# Patient Record
Sex: Male | Born: 1980 | Race: Asian | Hispanic: Yes | Marital: Married | Smoking: Current some day smoker
Health system: Southern US, Community
[De-identification: ages and names within clinical notes are randomized; demographics above are authoritative.]

## PROBLEM LIST (undated history)

## (undated) DIAGNOSIS — E119 Type 2 diabetes mellitus without complications: Secondary | ICD-10-CM

---

## 2008-09-11 ENCOUNTER — Emergency Department (HOSPITAL_COMMUNITY): Admission: EM | Admit: 2008-09-11 | Discharge: 2008-09-11 | Payer: Self-pay | Admitting: Emergency Medicine

## 2010-05-15 LAB — LIPASE, BLOOD: Lipase: 27 U/L (ref 11–59)

## 2010-05-15 LAB — URINALYSIS, ROUTINE W REFLEX MICROSCOPIC
Bilirubin Urine: NEGATIVE
Hgb urine dipstick: NEGATIVE
Ketones, ur: NEGATIVE mg/dL
Nitrite: NEGATIVE
Specific Gravity, Urine: 1.023 (ref 1.005–1.030)
pH: 6 (ref 5.0–8.0)

## 2010-05-15 LAB — CBC
Hemoglobin: 15.7 g/dL (ref 13.0–17.0)
MCHC: 34.1 g/dL (ref 30.0–36.0)
MCV: 90.8 fL (ref 78.0–100.0)
RBC: 5.06 MIL/uL (ref 4.22–5.81)
RDW: 13.2 % (ref 11.5–15.5)

## 2010-05-15 LAB — COMPREHENSIVE METABOLIC PANEL
BUN: 10 mg/dL (ref 6–23)
CO2: 22 mEq/L (ref 19–32)
Calcium: 9.4 mg/dL (ref 8.4–10.5)
Creatinine, Ser: 0.72 mg/dL (ref 0.4–1.5)
GFR calc non Af Amer: >60 mL/min (ref >60)
Glucose, Bld: 112 mg/dL — ABNORMAL HIGH (ref 70–99)
Total Protein: 7.9 g/dL (ref 6.0–8.3)

## 2010-05-15 LAB — DIFFERENTIAL
Eosinophils Absolute: 0.1 10*3/uL (ref 0.0–0.7)
Lymphocytes Relative: 8 % — ABNORMAL LOW (ref 12–46)
Lymphs Abs: 1.1 10*3/uL (ref 0.7–4.0)
Monocytes Relative: 3 % (ref 3–12)
Neutro Abs: 13.2 10*3/uL — ABNORMAL HIGH (ref 1.7–7.7)
Neutrophils Relative %: 89 % — ABNORMAL HIGH (ref 43–77)

## 2014-03-15 ENCOUNTER — Encounter (HOSPITAL_COMMUNITY): Payer: Self-pay | Admitting: *Deleted

## 2014-03-15 ENCOUNTER — Emergency Department (HOSPITAL_COMMUNITY)
Admission: EM | Admit: 2014-03-15 | Discharge: 2014-03-15 | Disposition: A | Discharge status: Home / Self Care | Payer: Self-pay | Attending: Emergency Medicine | Admitting: Emergency Medicine

## 2014-03-15 ENCOUNTER — Emergency Department (HOSPITAL_COMMUNITY): Payer: Self-pay

## 2014-03-15 DIAGNOSIS — Y990 Civilian activity done for income or pay: Secondary | ICD-10-CM | POA: Insufficient documentation

## 2014-03-15 DIAGNOSIS — Y9389 Activity, other specified: Secondary | ICD-10-CM | POA: Insufficient documentation

## 2014-03-15 DIAGNOSIS — S8992XA Unspecified injury of left lower leg, initial encounter: Secondary | ICD-10-CM | POA: Insufficient documentation

## 2014-03-15 DIAGNOSIS — W228XXA Striking against or struck by other objects, initial encounter: Secondary | ICD-10-CM | POA: Insufficient documentation

## 2014-03-15 DIAGNOSIS — M25562 Pain in left knee: Secondary | ICD-10-CM

## 2014-03-15 DIAGNOSIS — Y9289 Other specified places as the place of occurrence of the external cause: Secondary | ICD-10-CM | POA: Insufficient documentation

## 2014-03-15 MED ORDER — NAPROXEN 500 MG PO TABS
500.0000 mg | ORAL_TABLET | Freq: Once | ORAL | Status: AC
Start: 2014-03-15 — End: 2014-03-15
  Administered 2014-03-15: 500 mg via ORAL
  Filled 2014-03-15: qty 1

## 2014-03-15 MED ORDER — OXYCODONE-ACETAMINOPHEN 5-325 MG PO TABS
1.0000 | ORAL_TABLET | Freq: Once | ORAL | Status: AC
  Administered 2014-03-15: 1 via ORAL
  Filled 2014-03-15: qty 1

## 2014-03-15 MED ORDER — NAPROXEN 500 MG PO TABS: 500.0000 mg | ORAL_TABLET | Freq: Two times a day (BID) | ORAL | Status: AC

## 2014-03-15 MED ORDER — HYDROCODONE-ACETAMINOPHEN 5-325 MG PO TABS: 1.0000 | ORAL_TABLET | ORAL | Status: AC | PRN

## 2014-03-15 NOTE — ED Notes (Signed)
Pt speaks limited [place], speaks [place]. Between patient and daughter pt reports he hit his left knee on some metal today at work. Pain 5/10. Pt able to ambulate slowly.

## 2014-03-15 NOTE — Discharge Instructions (Signed)
Please follow the directions provided. Be sure to follow up with the orthopedic doctor for further management of your knee pain. Please wear your knee brace for comfort. Please use your crutches for comfort. Please take the naproxen twice a day to help with pain. Please take the Vicodin for pain not relieved by the naproxen. Don't hesitate to return for any new, worsening, or concerning symptoms.   SEEK IMMEDIATE MEDICAL CARE IF:  Your fingers or toes are numb or blue.  The pain is not responding to medications and continues to stay the same or get worse.  The pain in your joint becomes severe.  You develop a fever over 102 F (38.9 C).  It becomes impossible to move or use the joint.

## 2014-03-15 NOTE — ED Notes (Signed)
Pt to xray

## 2014-03-15 NOTE — ED Provider Notes (Signed)
CSN: [id number]     Arrival date & time 03/15/14  1306 History  This chart was scribed for a non-physician practitioner, Clerance Lav, NP working with No att. providers found by [place] Peace, ED Scribe. The patient was seen in [facility]. The patient's care was started at 2:45 PM.    Chief Complaint  Patient presents with  . Knee Pain    Patient is a 34 y.o. male presenting with knee pain. The history is provided by the patient. No language interpreter was used.  Knee Pain Location:  Knee Knee location:  L knee Pain details:    Severity:  Moderate (5/10)   Duration:  3 hours Chronicity:  New Dislocation: no   Exacerbated by: ambulation. Associated symptoms: no fever    HPI Comments: Jermaine Bates is a 34 y.o. male who presents to the Emergency Department complaining of left knee injury onset 11:00 AM while pt was at work. Pt hit his knee on a piece of metal. Pain exacerbated with ambulation. He hit his knee specifically on piece of metal attached to the machine that he was getting inside of. Pt rates pain as 5 or 6 when walking but otherwise does not hurt. He denies history of knee pain in the past.    History reviewed. No pertinent past medical history. No past surgical history on file. History reviewed. No pertinent family history. History  Substance Use Topics  . Smoking status: Not on file  . Smokeless tobacco: Not on file  . Alcohol Use: Not on file    Review of Systems  Constitutional: Negative for fever.  Gastrointestinal: Negative for nausea, vomiting and diarrhea.  Musculoskeletal: Positive for arthralgias.       Right knee pain.   Skin: Negative for wound.      Allergies  Review of patient's allergies indicates no known allergies.  Home Medications   Prior to Admission medications   Not on File   BP 131/80 mmHg  Pulse 90  Temp(Src) 97.6 F (36.4 C) (Oral)  Resp 16  SpO2 96% Physical Exam  Constitutional: He is oriented to person, place, and  time. He appears well-developed and well-nourished. No distress.  HENT:  Head: Normocephalic and atraumatic.  Eyes: Conjunctivae and EOM are normal.  Neck: Neck supple. No tracheal deviation present.  Cardiovascular: Normal rate.   Pulmonary/Chest: Effort normal. No respiratory distress.  Musculoskeletal: Normal range of motion. He exhibits tenderness.  Left Knee: TTP across entire patella. 5/5 strength with plantar and dorsal flexion. No obvious deformity or swelling. Small 1cm area of redness in center of patella, skin is intact.   Neurological: He is alert and oriented to person, place, and time.  Skin: Skin is warm and dry.  Psychiatric: He has a normal mood and affect. His behavior is normal.  Nursing note and vitals reviewed.   ED Course  Procedures (including critical care time) Labs Review Labs Reviewed - No data to display  Imaging Review Dg Knee Complete 4 Views Left  03/15/2014   CLINICAL DATA:  Hit left knee on metal today at work. Pain primarily involving the anterior aspect of the patella. No previous injury. Initial encounter.  EXAM: LEFT KNEE - COMPLETE 4+ VIEW  COMPARISON:  None.  FINDINGS: No fracture or dislocation. Joint spaces are preserved. No joint effusion. Regional soft tissues appear normal. No radiopaque foreign body.  IMPRESSION: No acute findings with special attention paid to the patella.   Electronically Signed   By: Holland Commons.D.  On: 03/15/2014 14:14     EKG Interpretation None     Medications - No data to display  2:52 PM- Treatment plan was discussed with patient who verbalizes understanding and agrees.   MDM   Final diagnoses:  Left knee pain   34 yo male with knee pain. No laceration noted or obvious injury noted. His X-Ray negative for obvious fracture or dislocation. Pain managed in ED. He was advised to follow up with orthopedics if symptoms persist for possibility of missed fracture diagnosis. Knee sleeve given in the ED, and used the  translator to discuss conservative therapy recommended and discussed. Patient will be dc home & is agreeable with above plan.    I personally performed the services described in this documentation, which was scribed in my presence. The recorded information has been reviewed and is accurate.  Filed Vitals:   03/15/14 1319 03/15/14 1503  BP: 131/80 129/75  Pulse: 90 91  Temp: 97.6 F (36.4 C)   TempSrc: Oral   Resp: 16 18  SpO2: 96% 98%   Meds given in ED:  Medications  oxyCODONE-acetaminophen (PERCOCET/ROXICET) 5-325 MG per tablet 1 tablet (1 tablet Oral Given 03/15/14 1502)  naproxen (NAPROSYN) tablet 500 mg (500 mg Oral Given 03/15/14 1502)    Discharge Medication List as of 03/15/2014  3:10 PM    START taking these medications   Details  HYDROcodone-acetaminophen (NORCO/VICODIN) 5-325 MG per tablet Take 1 tablet by mouth every 4 (four) hours as needed for moderate pain or severe pain., Starting 03/15/2014, Until Discontinued, Print    naproxen (NAPROSYN) 500 MG tablet Take 1 tablet (500 mg total) by mouth 2 (two) times daily., Starting 03/15/2014, Until Discontinued, Print           Harle BattiestElizabeth Jeffery Bachmeier, NP 03/28/14 1031  Toy CookeyMegan Docherty, MD 03/29/14 [phone]

## 2021-06-16 ENCOUNTER — Emergency Department (HOSPITAL_COMMUNITY): Payer: Self-pay

## 2021-06-16 ENCOUNTER — Other Ambulatory Visit: Payer: Self-pay

## 2021-06-16 ENCOUNTER — Emergency Department (HOSPITAL_COMMUNITY)
Admission: EM | Admit: 2021-06-16 | Discharge: 2021-06-17 | Disposition: A | Discharge status: Home / Self Care | Payer: Self-pay | Attending: Emergency Medicine | Admitting: Emergency Medicine

## 2021-06-16 DIAGNOSIS — R739 Hyperglycemia, unspecified: Secondary | ICD-10-CM | POA: Insufficient documentation

## 2021-06-16 DIAGNOSIS — K529 Noninfective gastroenteritis and colitis, unspecified: Secondary | ICD-10-CM | POA: Insufficient documentation

## 2021-06-16 DIAGNOSIS — R824 Acetonuria: Secondary | ICD-10-CM | POA: Insufficient documentation

## 2021-06-16 DIAGNOSIS — E871 Hypo-osmolality and hyponatremia: Secondary | ICD-10-CM | POA: Insufficient documentation

## 2021-06-16 DIAGNOSIS — R944 Abnormal results of kidney function studies: Secondary | ICD-10-CM | POA: Insufficient documentation

## 2021-06-16 DIAGNOSIS — Z7984 Long term (current) use of oral hypoglycemic drugs: Secondary | ICD-10-CM | POA: Insufficient documentation

## 2021-06-16 LAB — CBC WITH DIFFERENTIAL/PLATELET
Abs Immature Granulocytes: 0.03 10*3/uL (ref 0.00–0.07)
Basophils Absolute: 0 10*3/uL (ref 0.0–0.1)
Basophils Relative: 0 %
Eosinophils Absolute: 0.3 10*3/uL (ref 0.0–0.5)
Eosinophils Relative: 4 %
HCT: 47.8 % (ref 39.0–52.0)
Hemoglobin: 16.8 g/dL (ref 13.0–17.0)
Immature Granulocytes: 0 %
Lymphocytes Relative: 18 %
Lymphs Abs: 1.5 10*3/uL (ref 0.7–4.0)
MCH: 31.2 pg (ref 26.0–34.0)
MCHC: 35.1 g/dL (ref 30.0–36.0)
MCV: 88.8 fL (ref 80.0–100.0)
Monocytes Absolute: 0.5 10*3/uL (ref 0.1–1.0)
Monocytes Relative: 7 %
Neutro Abs: 5.7 10*3/uL (ref 1.7–7.7)
Neutrophils Relative %: 71 %
Platelets: 232 10*3/uL (ref 150–400)
RBC: 5.38 MIL/uL (ref 4.22–5.81)
RDW: 12 % (ref 11.5–15.5)
WBC: 8.1 10*3/uL (ref 4.0–10.5)
nRBC: 0 % (ref 0.0–0.2)

## 2021-06-16 LAB — URINALYSIS, ROUTINE W REFLEX MICROSCOPIC
Bacteria, UA: NONE SEEN
Bilirubin Urine: NEGATIVE
Glucose, UA: >=500 mg/dL — AB
Hgb urine dipstick: NEGATIVE
Ketones, ur: 5 mg/dL — AB
Leukocytes,Ua: NEGATIVE
Nitrite: NEGATIVE
Protein, ur: NEGATIVE mg/dL
Specific Gravity, Urine: >1.046 — ABNORMAL HIGH (ref 1.005–1.030)
pH: 8 (ref 5.0–8.0)

## 2021-06-16 LAB — COMPREHENSIVE METABOLIC PANEL
ALT: 35 U/L (ref 0–44)
AST: 28 U/L (ref 15–41)
Albumin: 4.3 g/dL (ref 3.5–5.0)
Alkaline Phosphatase: 79 U/L (ref 38–126)
Anion gap: 9 (ref 5–15)
BUN: 15 mg/dL (ref 6–20)
CO2: 25 mmol/L (ref 22–32)
Calcium: 8.5 mg/dL — ABNORMAL LOW (ref 8.9–10.3)
Chloride: 99 mmol/L (ref 98–111)
Creatinine, Ser: 0.52 mg/dL — ABNORMAL LOW (ref 0.61–1.24)
GFR, Estimated: >60 mL/min (ref >60)
Glucose, Bld: 363 mg/dL — ABNORMAL HIGH (ref 70–99)
Potassium: 3.8 mmol/L (ref 3.5–5.1)
Sodium: 133 mmol/L — ABNORMAL LOW (ref 135–145)
Total Bilirubin: 1 mg/dL (ref 0.3–1.2)
Total Protein: 7.8 g/dL (ref 6.5–8.1)

## 2021-06-16 LAB — BLOOD GAS, VENOUS
Acid-Base Excess: 3.7 mmol/L — ABNORMAL HIGH (ref 0.0–2.0)
Bicarbonate: 28.5 mmol/L — ABNORMAL HIGH (ref 20.0–28.0)
Drawn by: 54030
O2 Saturation: 78.6 %
Patient temperature: 37
pCO2, Ven: 43 mmHg — ABNORMAL LOW (ref 44–60)
pH, Ven: 7.43 (ref 7.25–7.43)
pO2, Ven: 42 mmHg (ref 32–45)

## 2021-06-16 LAB — LIPASE, BLOOD: Lipase: 41 U/L (ref 11–51)

## 2021-06-16 MED ORDER — SODIUM CHLORIDE 0.9 % IV BOLUS
1000.0000 mL | Freq: Once | INTRAVENOUS | Status: AC
  Administered 2021-06-16: 1000 mL via INTRAVENOUS

## 2021-06-16 MED ORDER — DICYCLOMINE HCL 10 MG/ML IM SOLN
20.0000 mg | Freq: Once | INTRAMUSCULAR | Status: AC
  Administered 2021-06-16: 20 mg via INTRAMUSCULAR
  Filled 2021-06-16: qty 2

## 2021-06-16 MED ORDER — ONDANSETRON HCL 4 MG/2ML IJ SOLN
4.0000 mg | Freq: Once | INTRAMUSCULAR | Status: AC
  Administered 2021-06-16: 4 mg via INTRAVENOUS
  Filled 2021-06-16: qty 2

## 2021-06-16 MED ORDER — IOHEXOL 300 MG/ML  SOLN
100.0000 mL | Freq: Once | INTRAMUSCULAR | Status: AC | PRN
  Administered 2021-06-16: 100 mL via INTRAVENOUS

## 2021-06-16 NOTE — ED Triage Notes (Signed)
Pt reports with vomiting, fever, abdominal pain, and diarrhea x 2 days.  ?

## 2021-06-16 NOTE — Discharge Instructions (Addendum)
Like we discussed, your symptoms appear consistent with gastroenteritis.  Your CT scan showed both enteritis as well as colitis. ? ?I am going to prescribe you 3 medications for your symptoms. ? ?The first medication is called Zofran.  This is a medication you can take up to 3 times per day for breakthrough nausea and vomiting.  Please only take it as prescribed. ? ?The second medication is called Bentyl.  This medication can help with diarrhea.  You can take it up to 2 times per day as needed for severe diarrhea. ? ?The third medication is called metformin.  This is for your high blood sugar.  Please take 500 mg twice per day.  This medication can cause stomach irritation as well as diarrhea.  Please follow-up with your regular doctor to have your blood sugar rechecked. ? ?If you develop any new or worsening symptoms please come back to the emergency department. ?

## 2021-06-16 NOTE — ED Provider Notes (Signed)
?[facility]-EMERGENCY DEPT ?Provider Note ? ? ?CSN: [id number] ?Arrival date & time: 06/16/21  2152 ? ?  ? ?History ? ?Chief Complaint  ?Patient presents with  ? Emesis  ? Diarrhea  ? Abdominal Pain  ? Fever  ? ? ?Jermaine Bates is a 41 y.o. male. ? ?HPI ?Patient is a 41 year old male who presents to the emergency department due to nausea, vomiting, diarrhea, abdominal pain, as well as subjective fevers.  States his symptoms started about 2 days ago and have been persistent.  Denies any current nausea.  States that he vomited 2 times.  Denies any hematemesis or hematochezia.  Denies chest pain, shortness of breath, cough, urinary complaints. ?  ? ?Home Medications ?Prior to Admission medications   ?Medication Sig Start Date End Date Taking? Authorizing Provider  ?dicyclomine (BENTYL) 20 MG tablet Take 1 tablet (20 mg total) by mouth 2 (two) times daily as needed for spasms. 06/17/21  Yes Placido SouJoldersma, Verble Styron, PA-C  ?metFORMIN (GLUCOPHAGE) 500 MG tablet Take 1 tablet (500 mg total) by mouth 2 (two) times daily with a meal. 06/17/21 08/16/21 Yes Placido SouJoldersma, Shamra Bradeen, PA-C  ?ondansetron (ZOFRAN-ODT) 4 MG disintegrating tablet Take 1 tablet (4 mg total) by mouth every 8 (eight) hours as needed for nausea or vomiting. 06/17/21  Yes Placido SouJoldersma, Kamori Barbier, PA-C  ?HYDROcodone-acetaminophen (NORCO/VICODIN) 5-325 MG per tablet Take 1 tablet by mouth every 4 (four) hours as needed for moderate pain or severe pain. 03/15/14   Harle Battiestysinger, Elizabeth, NP  ?naproxen (NAPROSYN) 500 MG tablet Take 1 tablet (500 mg total) by mouth 2 (two) times daily. 03/15/14   Harle Battiestysinger, Elizabeth, NP  ?   ? ?Allergies    ?Patient has no known allergies.   ? ?Review of Systems   ?Review of Systems  ?All other systems reviewed and are negative. ?Ten systems reviewed and are negative for acute change, except as noted in the HPI.   ?Physical Exam ?Updated Vital Signs ?BP 115/79 (BP Location: Right Arm)   Pulse 71   Temp 98.3 ?F (36.8 ?C) (Oral)   Resp 16    Ht 5\' 7"  (1.702 m)   Wt 79.4 kg   SpO2 100%   BMI 27.41 kg/m?  ?Physical Exam ?Vitals and nursing note reviewed.  ?Constitutional:   ?   General: He is not in acute distress. ?   Appearance: Normal appearance. He is well-developed. He is not ill-appearing, toxic-appearing or diaphoretic.  ?HENT:  ?   Head: Normocephalic and atraumatic.  ?   Right Ear: External ear normal.  ?   Left Ear: External ear normal.  ?   Nose: Nose normal.  ?   Mouth/Throat:  ?   Mouth: Mucous membranes are moist.  ?   Pharynx: Oropharynx is clear. No oropharyngeal exudate or posterior oropharyngeal erythema.  ?Eyes:  ?   Extraocular Movements: Extraocular movements intact.  ?Cardiovascular:  ?   Rate and Rhythm: Normal rate and regular rhythm.  ?   Pulses: Normal pulses.  ?   Heart sounds: Normal heart sounds. No murmur heard. ?  No friction rub. No gallop.  ?Pulmonary:  ?   Effort: Pulmonary effort is normal. No respiratory distress.  ?   Breath sounds: Normal breath sounds. No stridor. No wheezing, rhonchi or rales.  ?Abdominal:  ?   General: Abdomen is flat.  ?   Palpations: Abdomen is soft.  ?   Tenderness: There is abdominal tenderness in the epigastric area, periumbilical area and left lower quadrant.  ?  Comments: Abdomen is flat and soft.  Mild to moderate tenderness appreciated along the epigastrium, periumbilical region, as well as left lower quadrant.  ?Musculoskeletal:     ?   General: Normal range of motion.  ?   Cervical back: Normal range of motion and neck supple. No tenderness.  ?Skin: ?   General: Skin is warm and dry.  ?Neurological:  ?   General: No focal deficit present.  ?   Mental Status: He is alert and oriented to person, place, and time.  ?Psychiatric:     ?   Mood and Affect: Mood normal.     ?   Behavior: Behavior normal.  ? ?ED Results / Procedures / Treatments   ?Labs ?(all labs ordered are listed, but only abnormal results are displayed) ?Labs Reviewed  ?COMPREHENSIVE METABOLIC PANEL - Abnormal; Notable  for the following components:  ?    Result Value  ? Sodium 133 (*)   ? Glucose, Bld 363 (*)   ? Creatinine, Ser 0.52 (*)   ? Calcium 8.5 (*)   ? All other components within normal limits  ?URINALYSIS, ROUTINE W REFLEX MICROSCOPIC - Abnormal; Notable for the following components:  ? Specific Gravity, Urine >1.046 (*)   ? Glucose, UA >=500 (*)   ? Ketones, ur 5 (*)   ? All other components within normal limits  ?BLOOD GAS, VENOUS - Abnormal; Notable for the following components:  ? pCO2, Ven 43 (*)   ? Bicarbonate 28.5 (*)   ? Acid-Base Excess 3.7 (*)   ? All other components within normal limits  ?LIPASE, BLOOD  ?CBC WITH DIFFERENTIAL/PLATELET  ? ?EKG ?None ? ?Radiology ?CT ABDOMEN PELVIS W CONTRAST ? ?Result Date: 06/16/2021 ?CLINICAL DATA:  Abdominal pain, vomiting, fever, and diarrhea. EXAM: CT ABDOMEN AND PELVIS WITH CONTRAST TECHNIQUE: Multidetector CT imaging of the abdomen and pelvis was performed using the standard protocol following bolus administration of intravenous contrast. RADIATION DOSE REDUCTION: This exam was performed according to the departmental dose-optimization program which includes automated exposure control, adjustment of the mA and/or kV according to patient size and/or use of iterative reconstruction technique. CONTRAST:  OMNIPAQUE IOHEXOL 300 MG/ML  SOLN COMPARISON:  09/11/2008. FINDINGS: Lower chest: No acute abnormality. Hepatobiliary: A subcentimeter hypodensity is present in the posterior right lobe of the liver which is too small to further characterize. Fatty infiltration of the liver is noted. The gallbladder is without stones. No biliary ductal dilatation. Pancreas: Unremarkable. No pancreatic ductal dilatation or surrounding inflammatory changes. Spleen: Normal in size without focal abnormality. Adrenals/Urinary Tract: No adrenal nodule or mass. The kidneys enhance symmetrically. No renal calculus or hydronephrosis. There is a subcentimeter hypodensity in the right kidney  which is too small to further characterize. Stomach/Bowel: Stomach is within normal limits. The appendix is not definitely visualized on exam. No evidence of bowel wall thickening, distention, or inflammatory changes. Scattered air-fluid levels are noted in the small and large bowel. Vascular/Lymphatic: No significant vascular findings are present. No enlarged abdominal or pelvic lymph nodes. Reproductive: Prostate is unremarkable. Other: A fat containing inguinal hernia is noted on the left. No abdominopelvic ascites. Musculoskeletal: No acute or significant osseous findings. IMPRESSION: 1. No bowel obstruction or free air. 2. Scattered air-fluid levels in the small and large bowel, may be associated with enteritis/colitis. 3. Hepatic steatosis. Electronically Signed   By: Thornell Sartorius M.D.   On: 06/16/2021 23:45   ? ?Procedures ?Procedures  ? ?Medications Ordered in ED ?Medications  ?sodium chloride  0.9 % bolus 1,000 mL (1,000 mLs Intravenous New Bag/Given 06/16/21 2341)  ?ondansetron [facility]) injection 4 mg (4 mg Intravenous Given 06/16/21 2342)  ?dicyclomine (BENTYL) injection 20 mg (20 mg Intramuscular Given 06/16/21 2343)  ?iohexol (OMNIPAQUE) 300 MG/ML solution 100 mL (100 mLs Intravenous Contrast Given 06/16/21 2328)  ? ?ED Course/ Medical Decision Making/ A&P ?  ?                        ?Medical Decision Making ?Amount and/or Complexity of Data Reviewed ?Labs: ordered. ?Radiology: ordered. ? ?Risk ?Prescription drug management. ? ?Pt is a 41 y.o. male who presents to the emergency department due to abdominal pain, nausea, vomiting, diarrhea that started about 2 days ago. ? ?Labs: ?CBC without abnormalities. ?CMP with a pseudohyponatremia of 133, glucose of 363, creatinine of 0.52, calcium of 8.5. ?UA with elevated specific gravity, greater than 500 glucose, 5 ketones. ?Lipase of 41. ?VBG with a pH of 7.43. ? ?Imaging: ?CT scan of the abdomen/pelvis with contrast showed no bowel obstruction or free air.   Scattered air-fluid levels in the small and large bowel, may be associated with enteritis/colitis.  Hepatic steatosis. ? ?I, Placido Sou, PA-C, personally reviewed and evaluated these images and lab results as

## 2021-06-16 NOTE — ED Notes (Signed)
Pt ambulatory to CT scan

## 2021-06-17 MED ORDER — METFORMIN HCL 500 MG PO TABS: 500.0000 mg | ORAL_TABLET | Freq: Two times a day (BID) | ORAL | 1 refills | Status: AC

## 2021-06-17 MED ORDER — DICYCLOMINE HCL 20 MG PO TABS: 20.0000 mg | ORAL_TABLET | Freq: Two times a day (BID) | ORAL | 0 refills | Status: AC | PRN

## 2021-06-17 MED ORDER — ONDANSETRON 4 MG PO TBDP: 4.0000 mg | ORAL_TABLET | Freq: Three times a day (TID) | ORAL | 0 refills | Status: AC | PRN

## 2023-01-21 ENCOUNTER — Other Ambulatory Visit: Payer: Self-pay

## 2023-01-21 ENCOUNTER — Ambulatory Visit (HOSPITAL_COMMUNITY): Admission: RE | Admit: 2023-01-21 | Payer: Self-pay | Source: Ambulatory Visit

## 2023-01-21 ENCOUNTER — Encounter (HOSPITAL_COMMUNITY): Payer: Self-pay

## 2023-01-21 ENCOUNTER — Emergency Department (HOSPITAL_COMMUNITY)
Admission: EM | Admit: 2023-01-21 | Discharge: 2023-01-21 | Disposition: A | Discharge status: Home / Self Care | Payer: Self-pay | Attending: Student | Admitting: Student

## 2023-01-21 DIAGNOSIS — F17210 Nicotine dependence, cigarettes, uncomplicated: Secondary | ICD-10-CM | POA: Insufficient documentation

## 2023-01-21 DIAGNOSIS — R739 Hyperglycemia, unspecified: Secondary | ICD-10-CM

## 2023-01-21 DIAGNOSIS — Z7984 Long term (current) use of oral hypoglycemic drugs: Secondary | ICD-10-CM | POA: Insufficient documentation

## 2023-01-21 DIAGNOSIS — R42 Dizziness and giddiness: Secondary | ICD-10-CM | POA: Insufficient documentation

## 2023-01-21 DIAGNOSIS — E1165 Type 2 diabetes mellitus with hyperglycemia: Secondary | ICD-10-CM | POA: Insufficient documentation

## 2023-01-21 HISTORY — DX: Type 2 diabetes mellitus without complications: E11.9

## 2023-01-21 LAB — COMPREHENSIVE METABOLIC PANEL
ALT: 27 U/L (ref 0–44)
AST: 19 U/L (ref 15–41)
Albumin: 4.1 g/dL (ref 3.5–5.0)
Alkaline Phosphatase: 95 U/L (ref 38–126)
Anion gap: 9 (ref 5–15)
BUN: 18 mg/dL (ref 6–20)
CO2: 24 mmol/L (ref 22–32)
Calcium: 9.5 mg/dL (ref 8.9–10.3)
Chloride: 99 mmol/L (ref 98–111)
Creatinine, Ser: 0.47 mg/dL — ABNORMAL LOW (ref 0.61–1.24)
GFR, Estimated: >60 mL/min (ref >60)
Glucose, Bld: 461 mg/dL — ABNORMAL HIGH (ref 70–99)
Potassium: 4.2 mmol/L (ref 3.5–5.1)
Sodium: 132 mmol/L — ABNORMAL LOW (ref 135–145)
Total Bilirubin: 0.7 mg/dL (ref <1.2)
Total Protein: 7.5 g/dL (ref 6.5–8.1)

## 2023-01-21 LAB — I-STAT CHEM 8, ED
BUN: 20 mg/dL (ref 6–20)
Calcium, Ion: 1.2 mmol/L (ref 1.15–1.40)
Chloride: 98 mmol/L (ref 98–111)
Creatinine, Ser: 0.6 mg/dL — ABNORMAL LOW (ref 0.61–1.24)
Glucose, Bld: 451 mg/dL — ABNORMAL HIGH (ref 70–99)
HCT: 45 % (ref 39.0–52.0)
Hemoglobin: 15.3 g/dL (ref 13.0–17.0)
Potassium: 4.5 mmol/L (ref 3.5–5.1)
Sodium: 136 mmol/L (ref 135–145)
TCO2: 28 mmol/L (ref 22–32)

## 2023-01-21 LAB — DIFFERENTIAL
Abs Immature Granulocytes: 0.02 10*3/uL (ref 0.00–0.07)
Basophils Absolute: 0 10*3/uL (ref 0.0–0.1)
Basophils Relative: 0 %
Eosinophils Absolute: 0.1 10*3/uL (ref 0.0–0.5)
Eosinophils Relative: 1 %
Immature Granulocytes: 0 %
Lymphocytes Relative: 25 %
Lymphs Abs: 1.8 10*3/uL (ref 0.7–4.0)
Monocytes Absolute: 0.4 10*3/uL (ref 0.1–1.0)
Monocytes Relative: 5 %
Neutro Abs: 5.1 10*3/uL (ref 1.7–7.7)
Neutrophils Relative %: 69 %

## 2023-01-21 LAB — CBC
HCT: 43.2 % (ref 39.0–52.0)
Hemoglobin: 14.9 g/dL (ref 13.0–17.0)
MCH: 30.2 pg (ref 26.0–34.0)
MCHC: 34.5 g/dL (ref 30.0–36.0)
MCV: 87.4 fL (ref 80.0–100.0)
Platelets: 264 10*3/uL (ref 150–400)
RBC: 4.94 MIL/uL (ref 4.22–5.81)
RDW: 11.9 % (ref 11.5–15.5)
WBC: 7.3 10*3/uL (ref 4.0–10.5)
nRBC: 0 % (ref 0.0–0.2)

## 2023-01-21 LAB — CBG MONITORING, ED: Glucose-Capillary: 452 mg/dL — ABNORMAL HIGH (ref 70–99)

## 2023-01-21 LAB — ETHANOL: Alcohol, Ethyl (B): <10 mg/dL (ref <10)

## 2023-01-21 LAB — PROTIME-INR
INR: 1 (ref 0.8–1.2)
Prothrombin Time: 13.5 s (ref 11.4–15.2)

## 2023-01-21 LAB — APTT: aPTT: 27 s (ref 24–36)

## 2023-01-21 MED ORDER — SODIUM CHLORIDE 0.9% FLUSH: 3.0000 mL | Freq: Once | INTRAVENOUS | Status: DC

## 2023-01-21 MED ORDER — INSULIN ASPART 100 UNIT/ML IJ SOLN
10.0000 [IU] | Freq: Once | INTRAMUSCULAR | Status: AC
  Administered 2023-01-21: 10 [IU] via SUBCUTANEOUS

## 2023-01-21 NOTE — Discharge Instructions (Addendum)
Jermaine Bates was seen in the emergency department for evaluation of dizziness.  His symptoms have resolved but he does have significant hyperglycemia (elevated blood sugar).  This likely occurred because he has not taken the metformin in over a week because he has some side effects that he does not like.  Please follow-up with your primary care physician and ask about possible oral hypoglycemic agents due to metformin intolerance.  If these are not an option he may need to start on insulin.  Please return the emergency department if he has any nausea, vomiting, abdominal pain or any other concerning symptoms

## 2023-01-21 NOTE — ED Provider Notes (Signed)
[place] EMERGENCY DEPARTMENT AT [facility] Provider Note  CSN: [id number] Arrival date & time: 01/21/23 1622  Chief Complaint(s) Dizziness  HPI Jermaine Bates is a 42 y.o. male with PMH T2DM on metformin who presents emergency room for evaluation of dizziness.  He states that when he awoke this morning he got out of bed and felt unsteady on his feet with some dizziness.  The symptoms have since resolved but came to the emergency room for further evaluation.  He states that he has not been taking his metformin because it causes anxiety and thus he has been off of this medication for at least 1 week.  Currently denies chest pain, shortness of breath, abdominal pain, nausea, vomiting, headache, fever, numbness tingling, weakness or other systemic or neurological complaints.   Past Medical History Past Medical History:  Diagnosis Date   Diabetes mellitus without complication (HCC)    There are no active problems to display for this patient.  Home Medication(s) Prior to Admission medications   Medication Sig Start Date End Date Taking? Authorizing Provider  dicyclomine (BENTYL) 20 MG tablet Take 1 tablet (20 mg total) by mouth 2 (two) times daily as needed for spasms. 06/17/21   Placido Sou, PA-C  HYDROcodone-acetaminophen (NORCO/VICODIN) 5-325 MG per tablet Take 1 tablet by mouth every 4 (four) hours as needed for moderate pain or severe pain. 03/15/14   Harle Battiest, NP  metFORMIN (GLUCOPHAGE) 500 MG tablet Take 1 tablet (500 mg total) by mouth 2 (two) times daily with a meal. 06/17/21 08/16/21  Placido Sou, PA-C  naproxen (NAPROSYN) 500 MG tablet Take 1 tablet (500 mg total) by mouth 2 (two) times daily. 03/15/14   Harle Battiest, NP  ondansetron (ZOFRAN-ODT) 4 MG disintegrating tablet Take 1 tablet (4 mg total) by mouth every 8 (eight) hours as needed for nausea or vomiting. 06/17/21   Placido Sou, PA-C                                                                                                                                     Past Surgical History History reviewed. No pertinent surgical history. Family History History reviewed. No pertinent family history.  Social History Social History   Tobacco Use   Smoking status: Some Days    Types: Cigarettes   Smokeless tobacco: Never  Substance Use Topics   Alcohol use: Not Currently   Drug use: Never   Allergies Patient has no known allergies.  Review of Systems Review of Systems  Neurological:  Positive for dizziness.    Physical Exam Vital Signs  I have reviewed the triage vital signs BP 118/81 (BP Location: Right Arm)   Pulse 90   Temp 99 F (37.2 C) (Oral)   Resp 16   Ht 5\' 7"  (1.702 m)   Wt 79.4 kg   SpO2 100%   BMI 27.42 kg/m   Physical Exam Constitutional:  General: He is not in acute distress.    Appearance: Normal appearance.  HENT:     Head: Normocephalic and atraumatic.     Nose: No congestion or rhinorrhea.  Eyes:     General:        Right eye: No discharge.        Left eye: No discharge.     Extraocular Movements: Extraocular movements intact.     Pupils: Pupils are equal, round, and reactive to light.  Cardiovascular:     Rate and Rhythm: Normal rate and regular rhythm.     Heart sounds: No murmur heard. Pulmonary:     Effort: No respiratory distress.     Breath sounds: No wheezing or rales.  Abdominal:     General: There is no distension.     Tenderness: There is no abdominal tenderness.  Musculoskeletal:        General: Normal range of motion.     Cervical back: Normal range of motion.  Skin:    General: Skin is warm and dry.  Neurological:     General: No focal deficit present.     Mental Status: He is alert.     ED Results and Treatments Labs (all labs ordered are listed, but only abnormal results are displayed) Labs Reviewed  I-STAT CHEM 8, ED - Abnormal; Notable for the following components:      Result Value    Creatinine, Ser 0.60 (*)    Glucose, Bld 451 (*)    All other components within normal limits  CBG MONITORING, ED - Abnormal; Notable for the following components:   Glucose-Capillary 452 (*)    All other components within normal limits  PROTIME-INR  APTT  CBC  DIFFERENTIAL  COMPREHENSIVE METABOLIC PANEL  ETHANOL                                                                                                                          Radiology No results found.  Pertinent labs & imaging results that were available during my care of the patient were reviewed by me and considered in my medical decision making (see MDM for details).  Medications Ordered in ED Medications  sodium chloride flush (NS) 0.9 % injection 3 mL (has no administration in time range)  Procedures Procedures  (including critical care time)  Medical Decision Making / ED Course   This patient presents to the ED for concern of dizziness, this involves an extensive number of treatment options, and is a complaint that carries with it a high risk of complications and morbidity.  The differential diagnosis includes orthostatic presyncope, cardiogenic presyncope, electrolyte abnormality, hyperglycemia, vertigo  MDM: Patient seen in the emergency room for evaluation of dizziness.  Physical exam is unremarkable with no focal motor or sensory deficits.  No nystagmus including no nystagmus at rest.  Normal gait.  Laboratory evaluation with a glucose of 452, sodium 132 but is otherwise unremarkable.  With normal neurologic exam and symptoms that have resolved we will defer CT imaging at this time.  We had an extended discussion about the patient's hyperglycemia and currently he has not in DKA as he has a normal bicarb and normal anion gap.  I did encourage him to in the short-term continue to take  his metformin and speak with his outpatient primary care physician about other oral hypoglycemic agents.  10 units of insulin given here in the ED.  At this time he currently does not meet inpatient criteria for admission and will be discharged with outpatient follow-up.  Return precautions given which he voiced understanding.   Additional history obtained: -Additional history obtained from wife -External records from outside source obtained and reviewed including: Chart review including previous notes, labs, imaging, consultation notes   Lab Tests: -I ordered, reviewed, and interpreted labs.   The pertinent results include:   Labs Reviewed  I-STAT CHEM 8, ED - Abnormal; Notable for the following components:      Result Value   Creatinine, Ser 0.60 (*)    Glucose, Bld 451 (*)    All other components within normal limits  CBG MONITORING, ED - Abnormal; Notable for the following components:   Glucose-Capillary 452 (*)    All other components within normal limits  PROTIME-INR  APTT  CBC  DIFFERENTIAL  COMPREHENSIVE METABOLIC PANEL  ETHANOL      Medicines ordered and prescription drug management: Meds ordered this encounter  Medications   sodium chloride flush (NS) 0.9 % injection 3 mL    -I have reviewed the patients home medicines and have made adjustments as needed  Critical interventions none   Cardiac Monitoring: The patient was maintained on a cardiac monitor.  I personally viewed and interpreted the cardiac monitored which showed an underlying rhythm of: NSR  Social Determinants of Health:  Factors impacting patients care include: Spanish-speaking, interpreter used   Reevaluation: After the interventions noted above, I reevaluated the patient and found that they have :improved  Co morbidities that complicate the patient evaluation  Past Medical History:  Diagnosis Date   Diabetes mellitus without complication (HCC)       Dispostion: I considered admission  for this patient, but at this time he does not meet inpatient criteria for admission and will be discharged with outpatient follow-up     Final Clinical Impression(s) / ED Diagnoses Final diagnoses:  None     @PCDICTATION @    Glendora Score, MD 01/21/23 2143

## 2023-01-21 NOTE — ED Notes (Signed)
I  used interpreter to triage pt

## 2023-01-21 NOTE — ED Notes (Signed)
ED Provider at bedside. 

## 2023-01-21 NOTE — ED Triage Notes (Signed)
Pt c/o dizziness started upon waking at 0600 today. Pt denies another sx. Pt's LKW 01-20-23 at 2130

## 2023-02-20 IMAGING — CT CT ABD-PELV W/ CM
2 of 5 series · 16 of 46 positions shown, 18 images · IV contrast (agent unspecified)
Comparison: 09/11/2008.

CLINICAL DATA: Abdominal pain, vomiting, fever, and diarrhea.

EXAM:
CT ABDOMEN AND PELVIS WITH CONTRAST
TECHNIQUE: Multidetector CT imaging of the abdomen and pelvis was performed
using the standard protocol following bolus administration of
intravenous contrast.

[Series 2: axial st · axial · 0.86mm/px · z∈[-516,-76]mm · 13 of 104 slices shown, 15 images]
[im 8/104  soft-tissue]
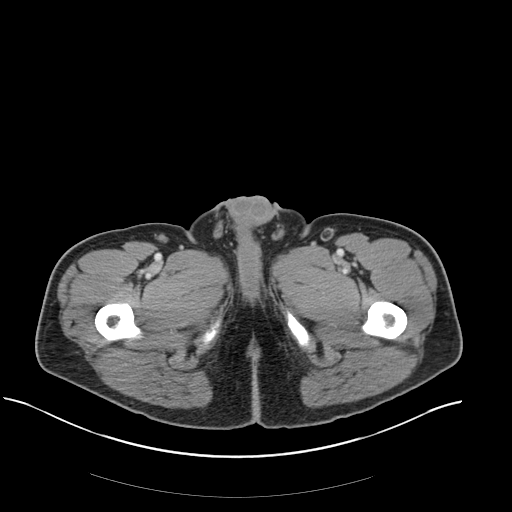
[im 8/104  bone]
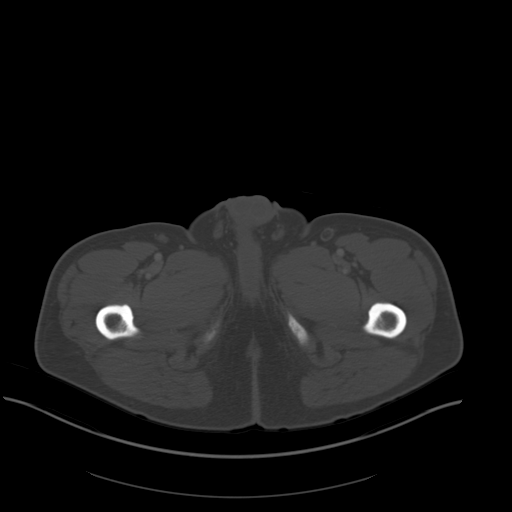
[im 15/104  soft-tissue]
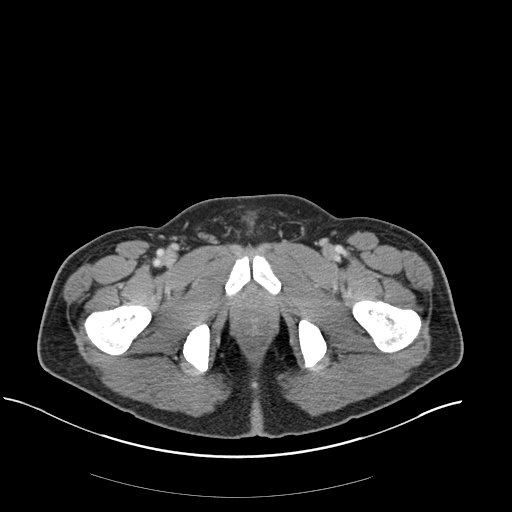
[im 23/104  soft-tissue]
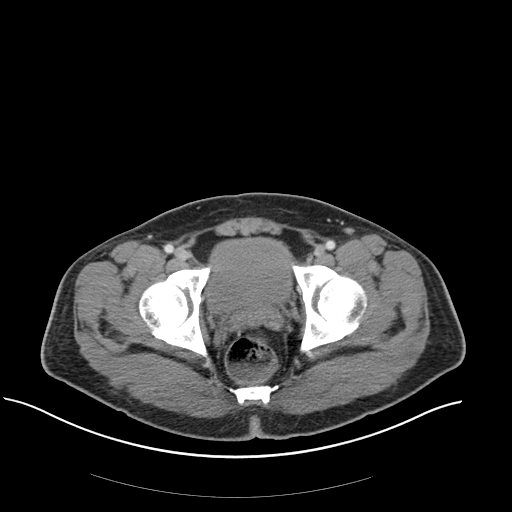
[im 30/104  soft-tissue]
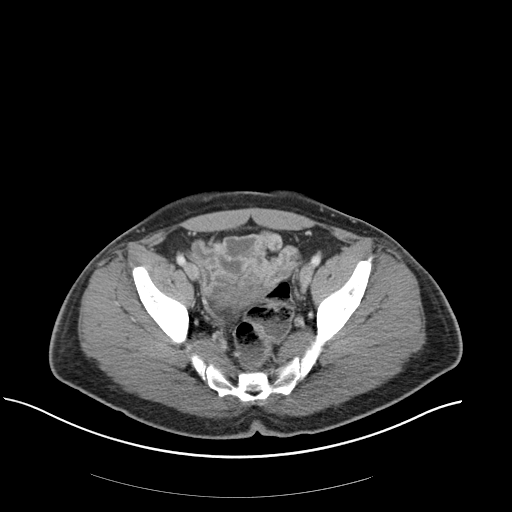
[im 37/104  soft-tissue]
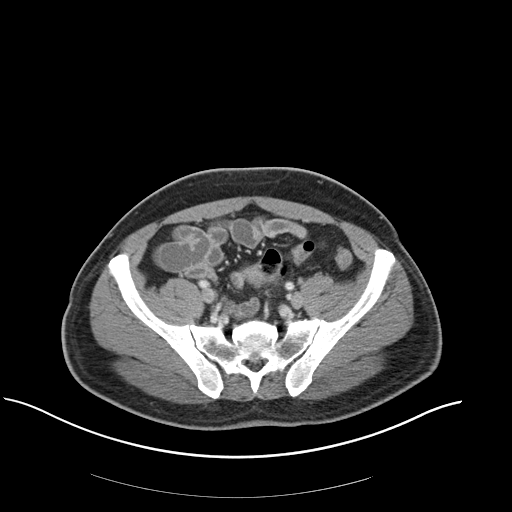
[im 45/104  soft-tissue]
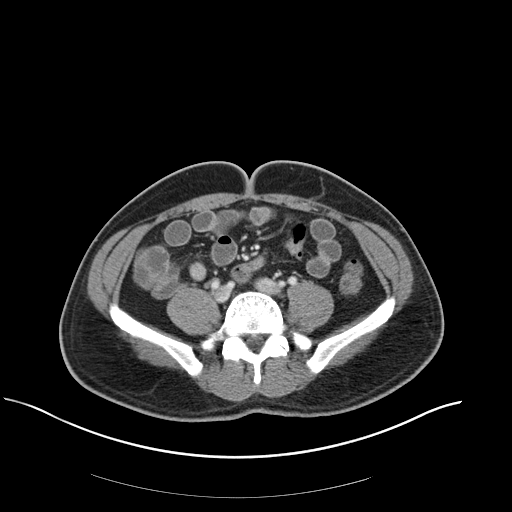
[im 52/104  soft-tissue]
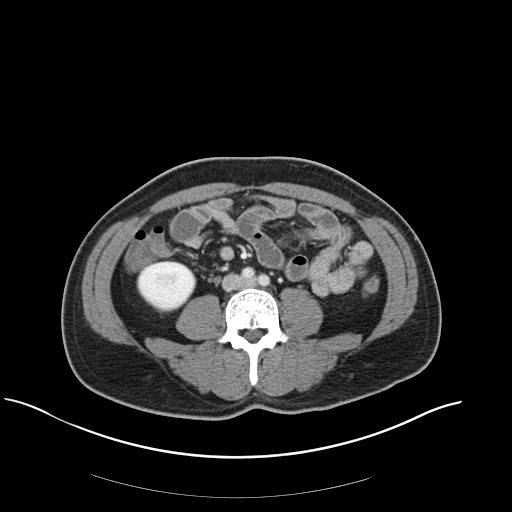
[im 59/104  soft-tissue]
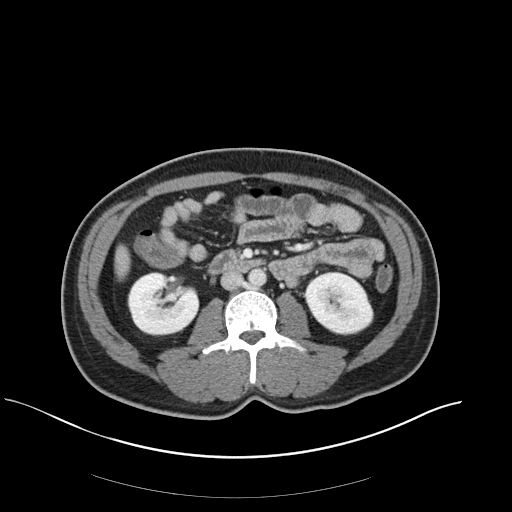
[im 67/104  soft-tissue]
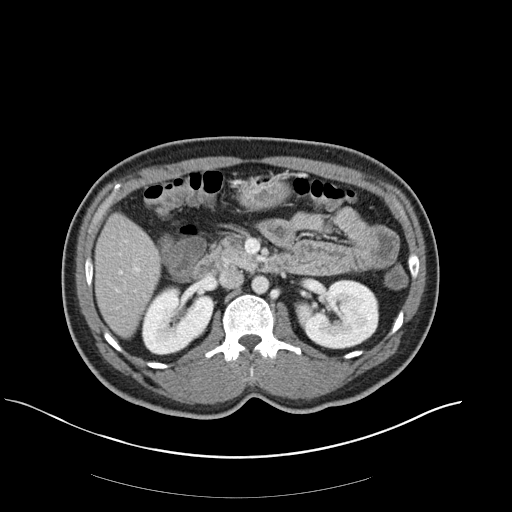
[im 67/104  bone]
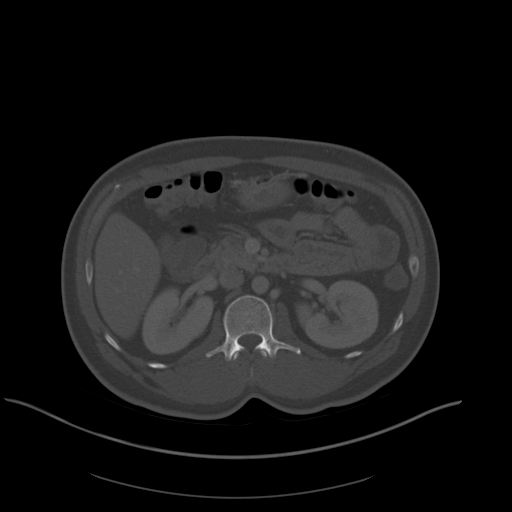
[im 74/104  soft-tissue]
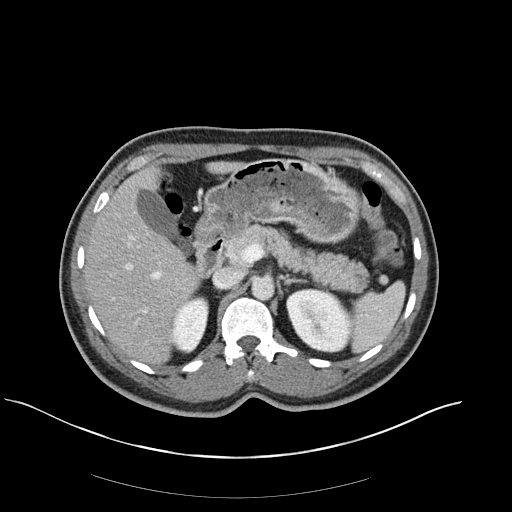
[im 81/104  soft-tissue]
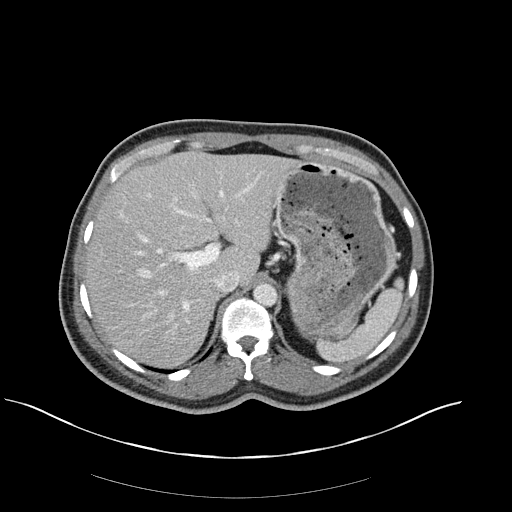
[im 89/104  soft-tissue]
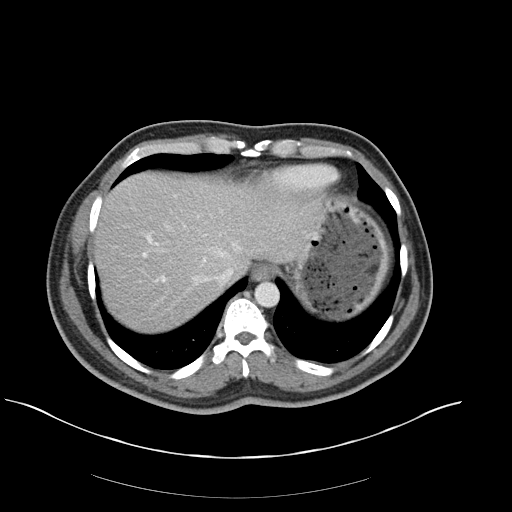
[im 96/104  soft-tissue]
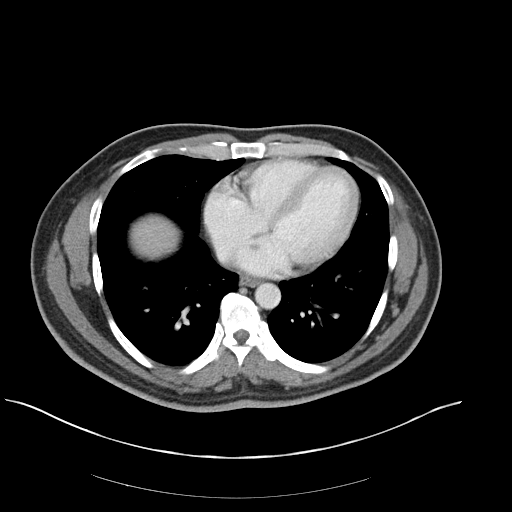

[Series 4: coronal st · coronal · 0.92mm/px · 3 of 134 slices shown]
[im 45/134  soft-tissue]
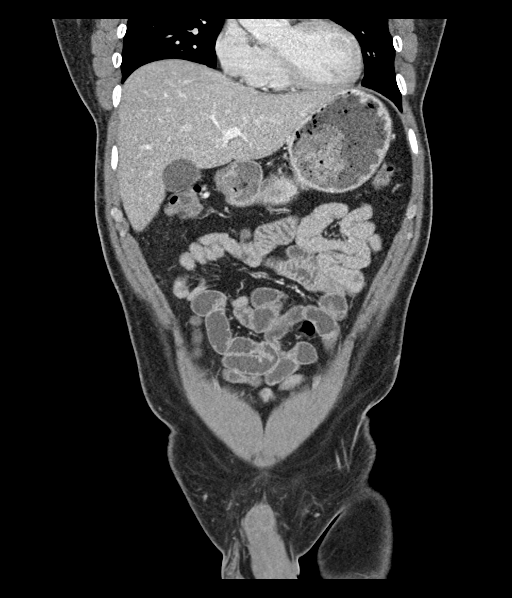
[im 60/134  soft-tissue]
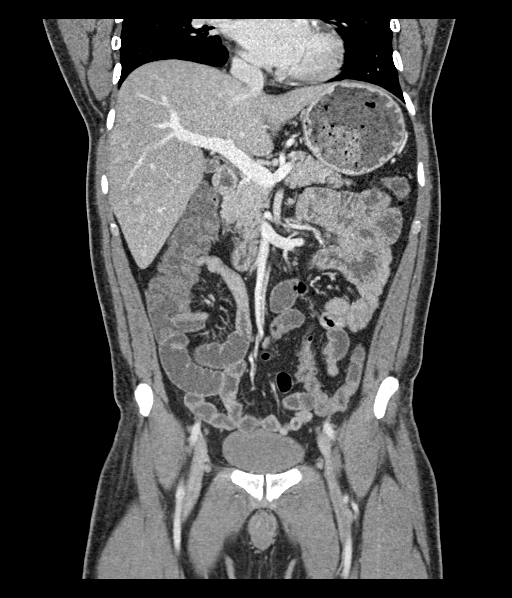
[im 74/134  soft-tissue]
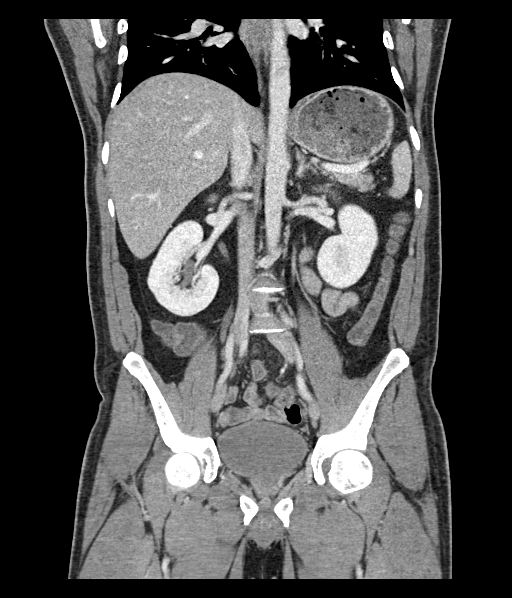

[16 of 46 positions shown; findings below may reference images not displayed]

RADIATION DOSE REDUCTION: This exam was performed according to the
departmental dose-optimization program which includes automated
exposure control, adjustment of the mA and/or kV according to
patient size and/or use of iterative reconstruction technique.

CONTRAST:  100mL OMNIPAQUE IOHEXOL 300 MG/ML  SOLN
FINDINGS: Lower chest: No acute abnormality.

Hepatobiliary: A subcentimeter hypodensity is present in the
posterior right lobe of the liver which is too small to further
characterize. Fatty infiltration of the liver is noted. The
gallbladder is without stones. No biliary ductal dilatation.

Pancreas: Unremarkable. No pancreatic ductal dilatation or
surrounding inflammatory changes.

Spleen: Normal in size without focal abnormality.

Adrenals/Urinary Tract: No adrenal nodule or mass. The kidneys
enhance symmetrically. No renal calculus or hydronephrosis. There is
a subcentimeter hypodensity in the right kidney which is too small
to further characterize.

Stomach/Bowel: Stomach is within normal limits. The appendix is not
definitely visualized on exam. No evidence of bowel wall thickening,
distention, or inflammatory changes. Scattered air-fluid levels are
noted in the small and large bowel.

Vascular/Lymphatic: No significant vascular findings are present. No
enlarged abdominal or pelvic lymph nodes.

Reproductive: Prostate is unremarkable.

Other: A fat containing inguinal hernia is noted on the left. No
abdominopelvic ascites.

Musculoskeletal: No acute or significant osseous findings.
IMPRESSION: 1. No bowel obstruction or free air.
2. Scattered air-fluid levels in the small and large bowel, may be
associated with enteritis/colitis.
3. Hepatic steatosis.

## 2023-12-04 ENCOUNTER — Emergency Department (HOSPITAL_COMMUNITY)
Admission: EM | Admit: 2023-12-04 | Discharge: 2023-12-04 | Disposition: A | Discharge status: Home / Self Care | Payer: Self-pay

## 2023-12-04 ENCOUNTER — Encounter (HOSPITAL_COMMUNITY): Payer: Self-pay | Admitting: Emergency Medicine

## 2023-12-04 ENCOUNTER — Emergency Department (HOSPITAL_COMMUNITY): Payer: Self-pay

## 2023-12-04 ENCOUNTER — Other Ambulatory Visit: Payer: Self-pay

## 2023-12-04 DIAGNOSIS — R519 Headache, unspecified: Secondary | ICD-10-CM | POA: Insufficient documentation

## 2023-12-04 DIAGNOSIS — D72829 Elevated white blood cell count, unspecified: Secondary | ICD-10-CM | POA: Insufficient documentation

## 2023-12-04 DIAGNOSIS — B349 Viral infection, unspecified: Secondary | ICD-10-CM | POA: Insufficient documentation

## 2023-12-04 LAB — RESP PANEL BY RT-PCR (RSV, FLU A&B, COVID)  RVPGX2
Influenza A by PCR: NEGATIVE
Influenza B by PCR: NEGATIVE
Resp Syncytial Virus by PCR: NEGATIVE
SARS Coronavirus 2 by RT PCR: NEGATIVE

## 2023-12-04 LAB — BASIC METABOLIC PANEL WITH GFR
Anion gap: 13 (ref 5–15)
BUN: 9 mg/dL (ref 6–20)
CO2: 24 mmol/L (ref 22–32)
Calcium: 9.7 mg/dL (ref 8.9–10.3)
Chloride: 93 mmol/L — ABNORMAL LOW (ref 98–111)
Creatinine, Ser: 0.63 mg/dL (ref 0.61–1.24)
GFR, Estimated: >60 mL/min (ref >60)
Glucose, Bld: 310 mg/dL — ABNORMAL HIGH (ref 70–99)
Potassium: 4.1 mmol/L (ref 3.5–5.1)
Sodium: 130 mmol/L — ABNORMAL LOW (ref 135–145)

## 2023-12-04 LAB — I-STAT CHEM 8, ED
BUN: 9 mg/dL (ref 6–20)
Calcium, Ion: 1.12 mmol/L — ABNORMAL LOW (ref 1.15–1.40)
Chloride: 95 mmol/L — ABNORMAL LOW (ref 98–111)
Creatinine, Ser: 0.5 mg/dL — ABNORMAL LOW (ref 0.61–1.24)
Glucose, Bld: 318 mg/dL — ABNORMAL HIGH (ref 70–99)
HCT: 43 % (ref 39.0–52.0)
Hemoglobin: 14.6 g/dL (ref 13.0–17.0)
Potassium: 4.1 mmol/L (ref 3.5–5.1)
Sodium: 133 mmol/L — ABNORMAL LOW (ref 135–145)
TCO2: 24 mmol/L (ref 22–32)

## 2023-12-04 LAB — CBC WITH DIFFERENTIAL/PLATELET
Abs Immature Granulocytes: 0.02 K/uL (ref 0.00–0.07)
Basophils Absolute: 0 K/uL (ref 0.0–0.1)
Basophils Relative: 0 %
Eosinophils Absolute: 0 K/uL (ref 0.0–0.5)
Eosinophils Relative: 0 %
HCT: 47.2 % (ref 39.0–52.0)
Hemoglobin: 15.7 g/dL (ref 13.0–17.0)
Immature Granulocytes: 0 %
Lymphocytes Relative: 11 %
Lymphs Abs: 1.2 K/uL (ref 0.7–4.0)
MCH: 29.2 pg (ref 26.0–34.0)
MCHC: 33.3 g/dL (ref 30.0–36.0)
MCV: 87.7 fL (ref 80.0–100.0)
Monocytes Absolute: 0.7 K/uL (ref 0.1–1.0)
Monocytes Relative: 6 %
Neutro Abs: 9 K/uL — ABNORMAL HIGH (ref 1.7–7.7)
Neutrophils Relative %: 83 %
Platelets: 244 K/uL (ref 150–400)
RBC: 5.38 MIL/uL (ref 4.22–5.81)
RDW: 11.4 % — ABNORMAL LOW (ref 11.5–15.5)
WBC: 10.9 K/uL — ABNORMAL HIGH (ref 4.0–10.5)
nRBC: 0 % (ref 0.0–0.2)

## 2023-12-04 LAB — CBG MONITORING, ED: Glucose-Capillary: 280 mg/dL — ABNORMAL HIGH (ref 70–99)

## 2023-12-04 LAB — I-STAT CG4 LACTIC ACID, ED: Lactic Acid, Venous: 1.2 mmol/L (ref 0.5–1.9)

## 2023-12-04 MED ORDER — LACTATED RINGERS IV BOLUS
1000.0000 mL | Freq: Once | INTRAVENOUS | Status: AC
  Administered 2023-12-04: 1000 mL via INTRAVENOUS

## 2023-12-04 MED ORDER — ACETAMINOPHEN 500 MG PO TABS
1000.0000 mg | ORAL_TABLET | Freq: Once | ORAL | Status: AC
  Administered 2023-12-04: 1000 mg via ORAL
  Filled 2023-12-04: qty 2

## 2023-12-04 MED ORDER — KETOROLAC TROMETHAMINE 15 MG/ML IJ SOLN
15.0000 mg | Freq: Once | INTRAMUSCULAR | Status: AC
  Administered 2023-12-04: 15 mg via INTRAVENOUS
  Filled 2023-12-04: qty 1

## 2023-12-04 NOTE — ED Provider Notes (Signed)
 [place] EMERGENCY DEPARTMENT AT [facility] Provider Note   CSN: [id number] Arrival date & time: 12/04/23  9240     Patient presents with: Headache   Jermaine Bates is a 43 y.o. male.   43 year old male presents for evaluation of headache.  He is Spanish-speaking interpreter services used.  He states last night he developed fever as well as neck pain and headache.  Denies any sick contacts or recent travel.  He has a history of diabetes.  He has not had a runny nose or cough and denies any nausea vomiting or diarrhea.  Denies any other symptoms or concerns.   Headache Associated symptoms: fever and neck pain   Associated symptoms: no abdominal pain, no back pain, no cough, no ear pain, no eye pain, no photophobia, no seizures, no sore throat and no vomiting        Prior to Admission medications   Medication Sig Start Date End Date Taking? Authorizing Provider  dicyclomine  (BENTYL ) 20 MG tablet Take 1 tablet (20 mg total) by mouth 2 (two) times daily as needed for spasms. 06/17/21   Joldersma, Logan, PA-C  HYDROcodone -acetaminophen  (NORCO/VICODIN) 5-325 MG per tablet Take 1 tablet by mouth every 4 (four) hours as needed for moderate pain or severe pain. 03/15/14   Bulah Norris, NP  metFORMIN  (GLUCOPHAGE ) 500 MG tablet Take 1 tablet (500 mg total) by mouth 2 (two) times daily with a meal. 06/17/21 08/16/21  Trey Christians, PA-C  naproxen  (NAPROSYN ) 500 MG tablet Take 1 tablet (500 mg total) by mouth 2 (two) times daily. 03/15/14   Bulah Norris, NP  ondansetron  (ZOFRAN -ODT) 4 MG disintegrating tablet Take 1 tablet (4 mg total) by mouth every 8 (eight) hours as needed for nausea or vomiting. 06/17/21   Joldersma, Logan, PA-C    Allergies: Patient has no known allergies.    Review of Systems  Constitutional:  Positive for fever. Negative for chills.  HENT:  Negative for ear pain and sore throat.   Eyes:  Negative for photophobia, pain and visual disturbance.   Respiratory:  Negative for cough and shortness of breath.   Cardiovascular:  Negative for chest pain and palpitations.  Gastrointestinal:  Negative for abdominal pain and vomiting.  Genitourinary:  Negative for dysuria and hematuria.  Musculoskeletal:  Positive for neck pain. Negative for arthralgias and back pain.  Skin:  Negative for color change and rash.  Neurological:  Positive for headaches. Negative for seizures and syncope.  All other systems reviewed and are negative.   Updated Vital Signs BP 124/84   Pulse 82   Temp 99.1 F (37.3 C) (Oral)   Resp 18   SpO2 96%   Physical Exam Vitals and nursing note reviewed.  Constitutional:      General: He is not in acute distress.    Appearance: He is well-developed. He is not ill-appearing.  HENT:     Head: Normocephalic and atraumatic.  Eyes:     Conjunctiva/sclera: Conjunctivae normal.  Cardiovascular:     Rate and Rhythm: Normal rate and regular rhythm.     Heart sounds: No murmur heard. Pulmonary:     Effort: Pulmonary effort is normal. No respiratory distress.     Breath sounds: Normal breath sounds.  Abdominal:     Palpations: Abdomen is soft.     Tenderness: There is no abdominal tenderness.  Musculoskeletal:        General: No swelling.     Cervical back: Neck supple.  Skin:  General: Skin is warm and dry.     Capillary Refill: Capillary refill takes less than 2 seconds.  Neurological:     Mental Status: He is alert. Mental status is at baseline.     Cranial Nerves: No cranial nerve deficit or facial asymmetry.     Motor: No weakness.     Comments: Patient with negative Brudzinski and Kernig signs, there is no neck stiffness but there is some tenderness to palpation over the bilateral neck musculature  Psychiatric:        Mood and Affect: Mood normal.     (all labs ordered are listed, but only abnormal results are displayed) Labs Reviewed  CBC WITH DIFFERENTIAL/PLATELET - Abnormal; Notable for the  following components:      Result Value   WBC 10.9 (*)    RDW 11.4 (*)    Neutro Abs 9.0 (*)    All other components within normal limits  BASIC METABOLIC PANEL WITH GFR - Abnormal; Notable for the following components:   Sodium 130 (*)    Chloride 93 (*)    Glucose, Bld 310 (*)    All other components within normal limits  I-STAT CHEM 8, ED - Abnormal; Notable for the following components:   Sodium 133 (*)    Chloride 95 (*)    Creatinine, Ser 0.50 (*)    Glucose, Bld 318 (*)    Calcium, Ion 1.12 (*)    All other components within normal limits  CBG MONITORING, ED - Abnormal; Notable for the following components:   Glucose-Capillary 280 (*)    All other components within normal limits  RESP PANEL BY RT-PCR (RSV, FLU A&B, COVID)  [id number]  URINALYSIS, ROUTINE W REFLEX MICROSCOPIC  I-STAT CG4 LACTIC ACID, ED  I-STAT CG4 LACTIC ACID, ED    EKG: None  Radiology: CT Head Wo Contrast Result Date: 12/04/2023 EXAM: CT HEAD WITHOUT CONTRAST 12/04/2023 09:17:38 AM TECHNIQUE: CT of the head was performed without the administration of intravenous contrast. Automated exposure control, iterative reconstruction, and/or weight based adjustment of the mA/kV was utilized to reduce the radiation dose to as low as reasonably achievable. COMPARISON: None available. CLINICAL HISTORY: Headache, fever; reports fever, headache, dizziness, and neck pain that started yesterday. FINDINGS: BRAIN AND VENTRICLES: No acute hemorrhage. No evidence of acute infarct. No hydrocephalus. No extra-axial collection. No mass effect or midline shift. ORBITS: No acute abnormality. SINUSES: No acute abnormality. SOFT TISSUES AND SKULL: No acute soft tissue abnormality. No skull fracture. IMPRESSION: 1. No acute intracranial abnormality. Electronically signed by: Gilmore Molt MD 12/04/2023 09:20 AM EDT RP Workstation: [id number]   DG Chest 1 View Result Date: 12/04/2023 EXAM: 1 VIEW(S) XRAY OF THE CHEST 12/04/2023  08:56:00 AM COMPARISON: None available. CLINICAL HISTORY: sob. Per chart - Pt reports fever, headache, dizziness, and neck pain that started yesterday. FINDINGS: LUNGS AND PLEURA: No focal pulmonary opacity. No pulmonary edema. No pleural effusion. No pneumothorax. HEART AND MEDIASTINUM: No acute abnormality of the cardiac and mediastinal silhouettes. BONES AND SOFT TISSUES: No acute osseous abnormality. IMPRESSION: 1. No acute process. Electronically signed by: Evalene Coho MD 12/04/2023 09:09 AM EDT RP Workstation: [id number]     Procedures   Medications Ordered in the ED  acetaminophen  (TYLENOL ) tablet 1,000 mg (1,000 mg Oral Given 12/04/23 0908)  ketorolac (TORADOL) 15 MG/ML injection 15 mg (15 mg Intravenous Given 12/04/23 0907)  lactated ringers bolus 1,000 mL (0 mLs Intravenous Stopped 12/04/23 1100)  Medical Decision Making Cardiac monitor interpretation: Sinus rhythm, no ectopy  Social Determinants of health: No primary care doctor, non-English-speaking  Patient is Spanish-speaking and interpreter service was used throughout the duration of the visit.  Patient here for fevers as well as headache and neck pain.  Workup here negative.  He has a critically mild leukocytosis and is negative for COVID flu and RSV.  Vitals are stable otherwise.  Was given IV fluids and Toradol and his symptoms have resolved.  CT head and chest x-ray are negative.  I strongly recommended a lumbar puncture to rule out meningitis.  He does not have any Brudzinski or Kernig sign and overall appears very well and if he does have meningitis it is likely viral etiology however I discussed with him that we cannot determine this without a lumbar puncture. I discussed the risks of not treating with antibiotics and possible poor outcomes. He declined the procedure or further workup and would like to go home as his symptoms have resolved.  He is aware of the risk.  I discussed this  with him and his cousin and wife at bedside.  He will stay with them tonight and they will continue to monitor him.  Advised Tylenol  Motrin as needed for pain and given very strict return precautions to return for any new or worsening symptoms including but not limited to worsening headaches, nausea, vomiting, confusion or persistent fevers.  He will be discharged.  Patient and family feel comfortable with this.  Problems Addressed: Acute nonintractable headache, unspecified headache type: acute illness or injury Viral syndrome: acute illness or injury  Amount and/or Complexity of Data Reviewed External Data Reviewed: notes.    Details: No prior records for review Labs: ordered. Decision-making details documented in ED Course.    Details: Ordered and reviewed by me and unremarkable except for mild leukocytosis Radiology: ordered and independent interpretation performed. Decision-making details documented in ED Course.    Details: Ordered and inter by me independently radiology Chest x-ray: Shows no acute abnormality and chest CT head: Shows no acute intracranial process  Risk OTC drugs. Prescription drug management. Drug therapy requiring intensive monitoring for toxicity. Diagnosis or treatment significantly limited by social determinants of health.    Final diagnoses:  Acute nonintractable headache, unspecified headache type  Viral syndrome    ED Discharge Orders     None          Gennaro Duwaine CROME, DO 12/04/23 1534

## 2023-12-04 NOTE — ED Triage Notes (Signed)
 Pt reports fever, headache, dizziness, and neck pain that started yesterday.

## 2023-12-04 NOTE — Discharge Instructions (Signed)
 Follow up with your primary care doctor in 1 week. You can alternate tylenol  and motrin as needed for fevers and pain. You can pick these up at the pharmacy they are over the counter. You were offered a lumbar puncture/spinal tap in the ED to rule out meningitis but you declined. If your symptoms get worse including worsening headache, nausea, vision changes, vomiting, or confusion or persistent fever you should return to the ER for a lumbar puncture.
